# Patient Record
Sex: Female | Born: 1935 | Hispanic: No | Marital: Married | Smoking: Never smoker
Health system: Southern US, Community
[De-identification: ages and names within clinical notes are randomized; demographics above are authoritative.]

## PROBLEM LIST (undated history)

## (undated) DIAGNOSIS — K219 Gastro-esophageal reflux disease without esophagitis: Secondary | ICD-10-CM

## (undated) DIAGNOSIS — I10 Essential (primary) hypertension: Secondary | ICD-10-CM

## (undated) DIAGNOSIS — R12 Heartburn: Secondary | ICD-10-CM

---

## 2006-08-04 ENCOUNTER — Encounter: Admission: RE | Admit: 2006-08-04 | Discharge: 2006-08-04 | Payer: Self-pay | Admitting: Family Medicine

## 2009-10-01 ENCOUNTER — Emergency Department (HOSPITAL_COMMUNITY): Admission: EM | Admit: 2009-10-01 | Discharge: 2009-10-01 | Payer: Self-pay | Admitting: Emergency Medicine

## 2015-02-17 ENCOUNTER — Other Ambulatory Visit: Payer: Self-pay | Admitting: Internal Medicine

## 2015-02-17 DIAGNOSIS — Z1231 Encounter for screening mammogram for malignant neoplasm of breast: Secondary | ICD-10-CM

## 2015-02-24 ENCOUNTER — Ambulatory Visit
Admission: RE | Admit: 2015-02-24 | Discharge: 2015-02-24 | Disposition: A | Discharge status: Home / Self Care | Payer: Medicare Other | Source: Ambulatory Visit | Attending: Internal Medicine | Admitting: Internal Medicine

## 2015-02-24 DIAGNOSIS — Z1231 Encounter for screening mammogram for malignant neoplasm of breast: Secondary | ICD-10-CM

## 2016-09-05 ENCOUNTER — Other Ambulatory Visit: Payer: Self-pay | Admitting: Internal Medicine

## 2016-09-05 DIAGNOSIS — E041 Nontoxic single thyroid nodule: Secondary | ICD-10-CM

## 2016-09-05 DIAGNOSIS — M542 Cervicalgia: Secondary | ICD-10-CM

## 2016-09-19 ENCOUNTER — Ambulatory Visit
Admission: RE | Admit: 2016-09-19 | Discharge: 2016-09-19 | Disposition: A | Discharge status: Home / Self Care | Payer: Medicare Other | Source: Ambulatory Visit | Attending: Internal Medicine | Admitting: Internal Medicine

## 2016-09-19 DIAGNOSIS — E041 Nontoxic single thyroid nodule: Secondary | ICD-10-CM

## 2016-09-19 DIAGNOSIS — M542 Cervicalgia: Secondary | ICD-10-CM

## 2016-10-03 ENCOUNTER — Other Ambulatory Visit: Payer: Self-pay | Admitting: Internal Medicine

## 2016-10-03 DIAGNOSIS — E041 Nontoxic single thyroid nodule: Secondary | ICD-10-CM

## 2016-10-26 ENCOUNTER — Other Ambulatory Visit (HOSPITAL_COMMUNITY)
Admission: RE | Admit: 2016-10-26 | Discharge: 2016-10-26 | Disposition: A | Discharge status: Home / Self Care | Payer: Medicare Other | Source: Ambulatory Visit | Attending: Radiology | Admitting: Radiology

## 2016-10-26 ENCOUNTER — Ambulatory Visit
Admission: RE | Admit: 2016-10-26 | Discharge: 2016-10-26 | Disposition: A | Discharge status: Home / Self Care | Payer: Medicare Other | Source: Ambulatory Visit | Attending: Internal Medicine | Admitting: Internal Medicine

## 2016-10-26 DIAGNOSIS — E041 Nontoxic single thyroid nodule: Secondary | ICD-10-CM | POA: Diagnosis present

## 2016-12-07 ENCOUNTER — Other Ambulatory Visit: Payer: Self-pay | Admitting: Internal Medicine

## 2016-12-07 DIAGNOSIS — Z1231 Encounter for screening mammogram for malignant neoplasm of breast: Secondary | ICD-10-CM

## 2016-12-22 ENCOUNTER — Ambulatory Visit
Admission: RE | Admit: 2016-12-22 | Discharge: 2016-12-22 | Disposition: A | Discharge status: Home / Self Care | Payer: Medicare Other | Source: Ambulatory Visit | Attending: Internal Medicine | Admitting: Internal Medicine

## 2016-12-22 DIAGNOSIS — Z1231 Encounter for screening mammogram for malignant neoplasm of breast: Secondary | ICD-10-CM

## 2017-05-09 IMAGING — US US THYROID
1 series · 12 of 25 positions shown · non-contrast
Comparison: None.

CLINICAL DATA: Palpable abnormality.  Left-sided neck pain.

EXAM:
THYROID ULTRASOUND
TECHNIQUE: Ultrasound examination of the thyroid gland and adjacent soft
tissues was performed.

[Series 1: us thyroid · 0.06mm/px · 12 of 53 slices shown]
[im 3/53]
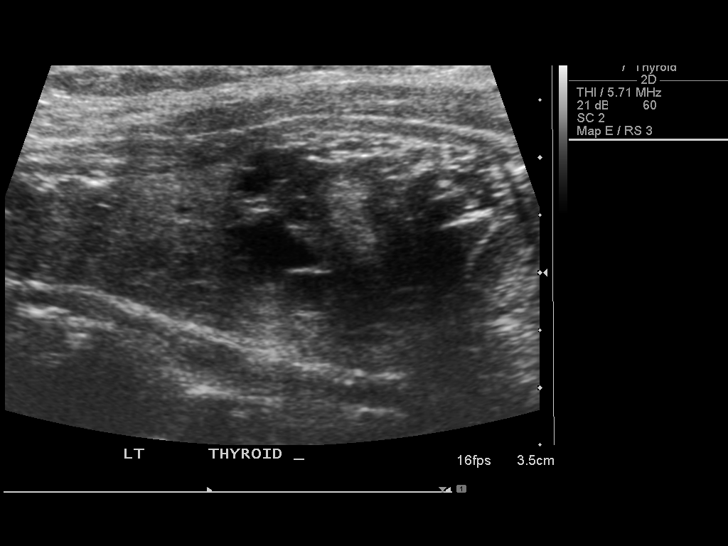
[im 7/53]
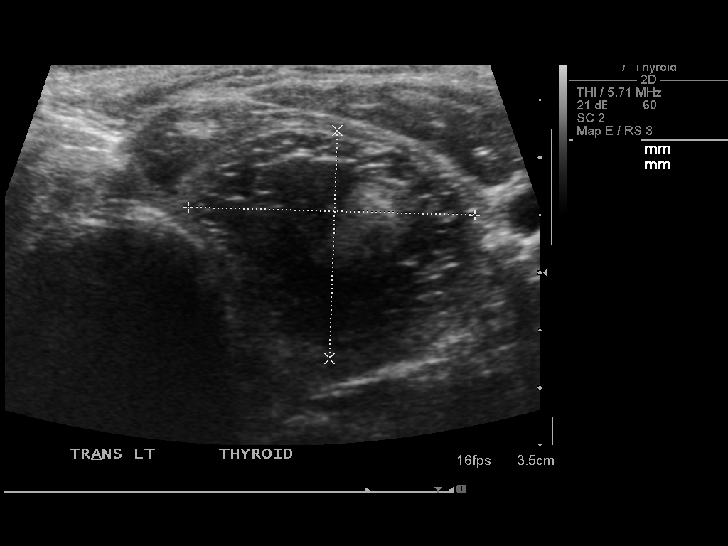
[im 11/53]
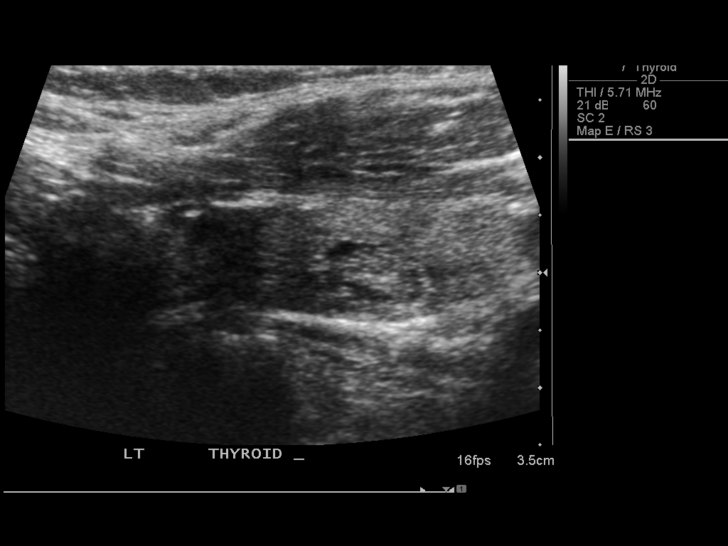
[im 16/53]
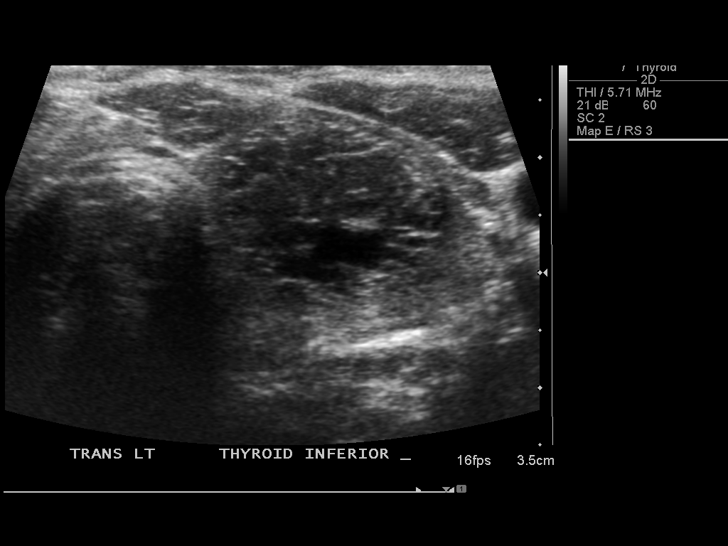
[im 20/53]
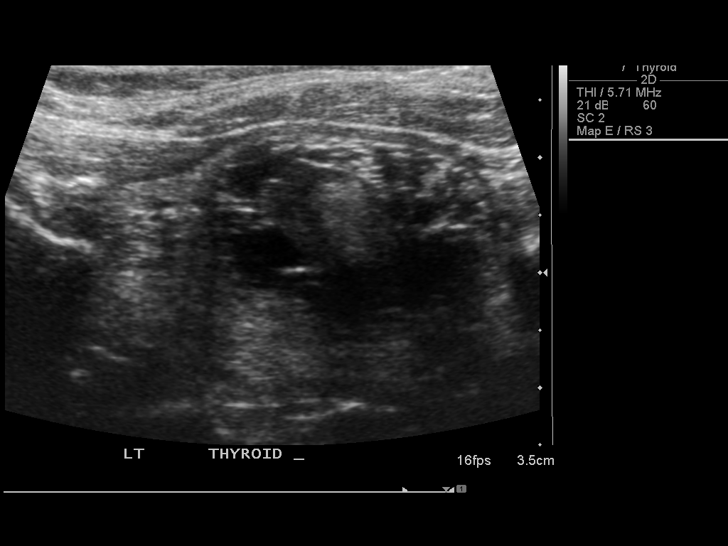
[im 24/53]
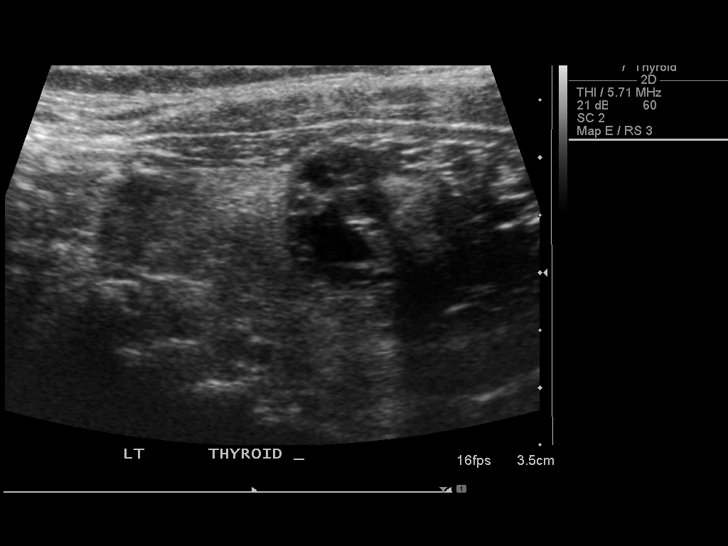
[im 29/53]
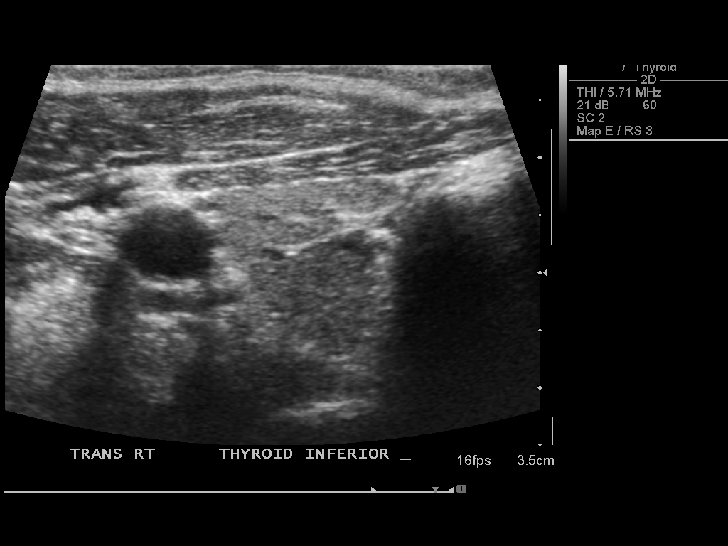
[im 33/53]
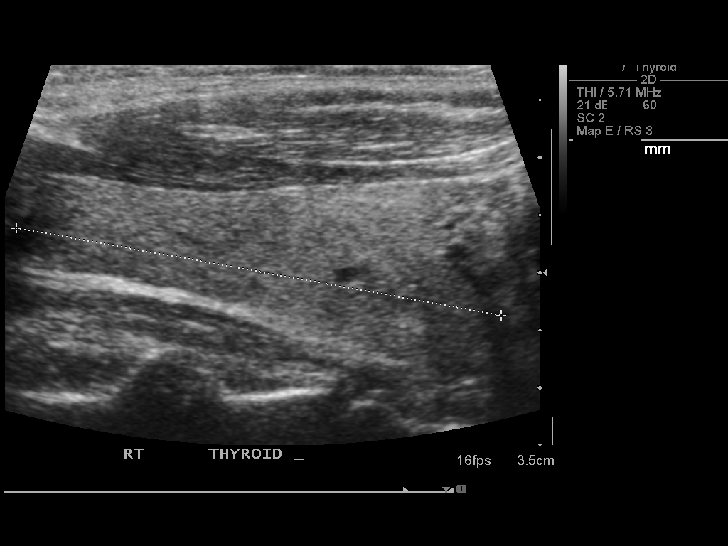
[im 37/53]
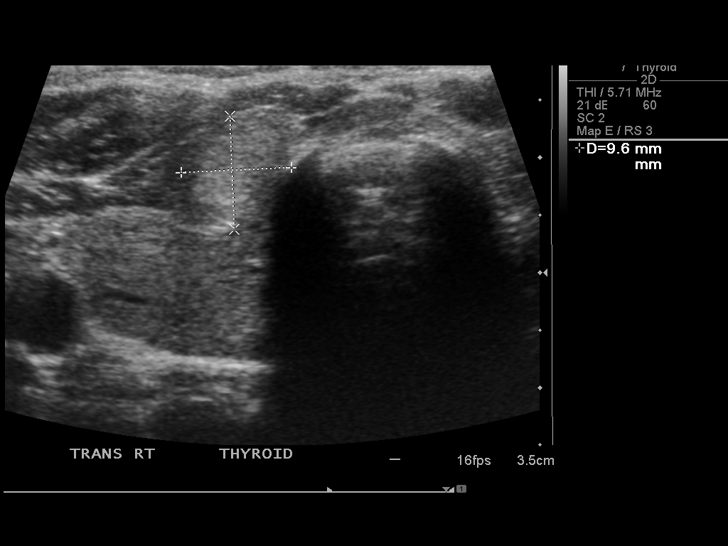
[im 42/53]
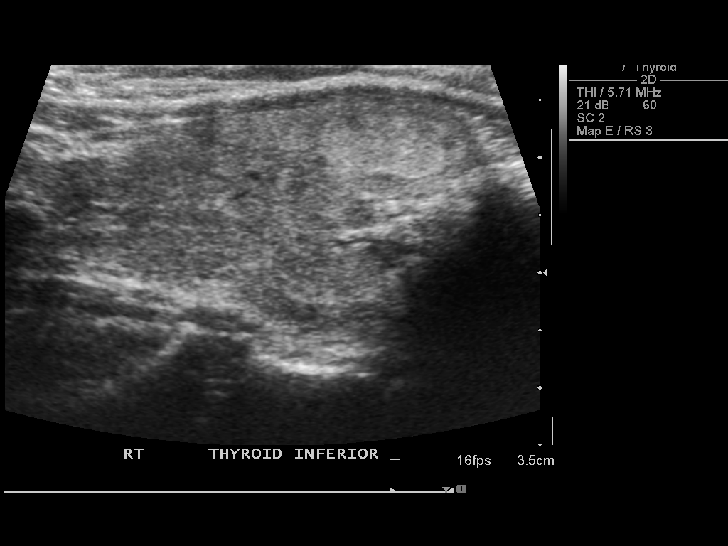
[im 46/53]
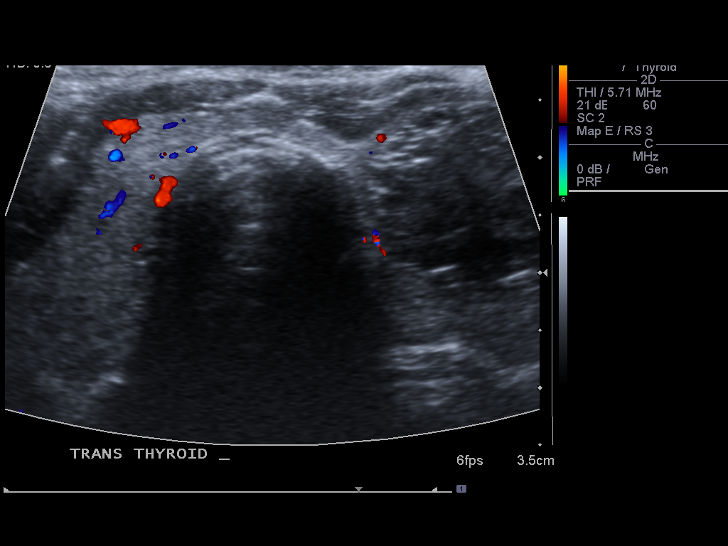
[im 50/53]
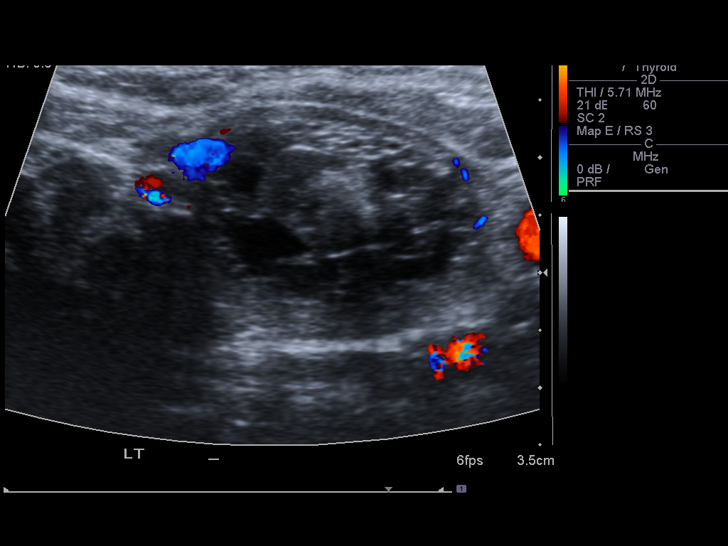

[12 of 25 positions shown; findings below may reference images not displayed]

FINDINGS: Parenchymal Echotexture: Moderately heterogenous

Estimated total number of nodules >/= 1 cm: 4

Number of spongiform nodules >/=  2 cm not described below (TR1): 0

Number of mixed cystic and solid nodules >/= 1.5 cm not described
below (TR2): 0

_________________________________________________________

Isthmus: Measures 0.3 cm in thickness.

Nodule # 1:

Location: Isthmus; Mid, right side of the isthmus.

Size: 1.4 x 1.0 x 1.0 cm.

Composition: solid/almost completely solid (2)

Echogenicity: isoechoic (1)

Shape: not taller-than-wide (0)

Margins: extra-thyroidal extension (3)

Echogenic foci: none (0)

ACR TI-RADS total points: 6.

ACR TI-RADS risk category: TR4 (4-6 points).

ACR TI-RADS recommendations:

*Given size (>/= 1 - 1.4 cm) and appearance, a follow-up ultrasound
in 1 year should be considered based on TI-RADS criteria.

_________________________________________________________

Right lobe: Measures 4.9 x 2.1 x 2.5 cm.

Nodule # 2:

Location: Right; Inferior

Size: 1.4 x 1.1 x 1.1 cm.

Composition: solid/almost completely solid (2)

Echogenicity: isoechoic (1)

Shape: not taller-than-wide (0)

Margins: extra-thyroidal extension (3)

Echogenic foci: none (0)

ACR TI-RADS total points: 6.

ACR TI-RADS risk category: TR4 (4-6 points).

ACR TI-RADS recommendations: *Given size (>/= 1 - 1.4 cm) and
appearance, a follow-up ultrasound in 1 year should be considered
based on TI-RADS criteria.

_________________________________________________________

Left lobe: Measures 4.3 x 2.1 x 1.7 cm.

Nodule # 3:

Location: Left; Inferior

Size: 3.2 x 2.0 x 2.5 cm.

Composition: cannot determine (2)

Echogenicity: very hypoechoic (3)

Shape: not taller-than-wide (0)

Margins: ill-defined (0)

Echogenic foci: none (0)

ACR TI-RADS total points: 5.

ACR TI-RADS risk category: TR4 (4-6 points).

ACR TI-RADS recommendations:

**Given size (>/= 1.5 cm) and appearance, fine needle aspiration of
this moderately suspicious nodule should be considered based on
TI-RADS criteria.

Nodule # 4:

Location: Left; Mid

Size: 1.1 x 0.7 x 1.0 cm.

Composition: spongiform (0)

Echogenicity: isoechoic (1)

Shape: not taller-than-wide (0)

Margins: smooth (0)

Echogenic foci: none (0)

ACR TI-RADS total points: 1.

ACR TI-RADS risk category: TR1 (0-1 points).

ACR TI-RADS recommendations:

This nodule does NOT meet TI-RADS criteria for biopsy or dedicated
follow-up.

There may be another small spongiform nodule in the superior left
thyroid lobe.
IMPRESSION: Bilateral thyroid nodules. The dominant left thyroid nodule (Nodule
#3) meets criteria for biopsy.

The nodule in the inferior right thyroid lobe and nodule along the
right side of the isthmus both meet criteria for 1 year follow-up.

The above is in keeping with the ACR TI-RADS recommendations - [HOSPITAL] 8513;[DATE].

## 2017-11-24 ENCOUNTER — Encounter (HOSPITAL_COMMUNITY): Payer: Self-pay

## 2017-11-24 ENCOUNTER — Other Ambulatory Visit: Payer: Self-pay

## 2017-11-24 DIAGNOSIS — R0789 Other chest pain: Secondary | ICD-10-CM | POA: Insufficient documentation

## 2017-11-24 DIAGNOSIS — I10 Essential (primary) hypertension: Secondary | ICD-10-CM | POA: Insufficient documentation

## 2017-11-24 DIAGNOSIS — K219 Gastro-esophageal reflux disease without esophagitis: Secondary | ICD-10-CM | POA: Diagnosis not present

## 2017-11-24 DIAGNOSIS — E876 Hypokalemia: Secondary | ICD-10-CM | POA: Insufficient documentation

## 2017-11-24 DIAGNOSIS — R1013 Epigastric pain: Secondary | ICD-10-CM | POA: Diagnosis present

## 2017-11-24 LAB — URINALYSIS, ROUTINE W REFLEX MICROSCOPIC
Bacteria, UA: NONE SEEN
Bilirubin Urine: NEGATIVE
GLUCOSE, UA: NEGATIVE mg/dL
Hgb urine dipstick: NEGATIVE
KETONES UR: NEGATIVE mg/dL
Nitrite: NEGATIVE
PH: 6 (ref 5.0–8.0)
Protein, ur: NEGATIVE mg/dL
Specific Gravity, Urine: 1.006 (ref 1.005–1.030)

## 2017-11-24 LAB — COMPREHENSIVE METABOLIC PANEL
ALK PHOS: 72 U/L (ref 38–126)
ALT: 21 U/L (ref 14–54)
AST: 25 U/L (ref 15–41)
Albumin: 4 g/dL (ref 3.5–5.0)
Anion gap: 13 (ref 5–15)
BILIRUBIN TOTAL: 0.5 mg/dL (ref 0.3–1.2)
BUN: 14 mg/dL (ref 6–20)
CHLORIDE: 108 mmol/L (ref 101–111)
CO2: 23 mmol/L (ref 22–32)
CREATININE: 1.05 mg/dL — AB (ref 0.44–1.00)
Calcium: 9.1 mg/dL (ref 8.9–10.3)
GFR calc Af Amer: 53 mL/min — ABNORMAL LOW (ref >60)
GFR, EST NON AFRICAN AMERICAN: 46 mL/min — AB (ref >60)
Glucose, Bld: 106 mg/dL — ABNORMAL HIGH (ref 65–99)
Potassium: 2.8 mmol/L — ABNORMAL LOW (ref 3.5–5.1)
Sodium: 144 mmol/L (ref 135–145)
Total Protein: 7.2 g/dL (ref 6.5–8.1)

## 2017-11-24 LAB — CBC
HCT: 38.2 % (ref 36.0–46.0)
Hemoglobin: 12.5 g/dL (ref 12.0–15.0)
MCH: 26.1 pg (ref 26.0–34.0)
MCHC: 32.7 g/dL (ref 30.0–36.0)
MCV: 79.7 fL (ref 78.0–100.0)
PLATELETS: 254 10*3/uL (ref 150–400)
RBC: 4.79 MIL/uL (ref 3.87–5.11)
RDW: 15.3 % (ref 11.5–15.5)
WBC: 5.8 10*3/uL (ref 4.0–10.5)

## 2017-11-24 LAB — LIPASE, BLOOD: LIPASE: 42 U/L (ref 11–51)

## 2017-11-24 NOTE — ED Triage Notes (Signed)
Pt here with family, pt speaks rade, pt endorses abd pain that began this morning with 2 episodes of diarrhea. Hypertensive in triage and hx of same. Has hx of heart burn and this feels the same.

## 2017-11-25 ENCOUNTER — Emergency Department (HOSPITAL_COMMUNITY)
Admission: EM | Admit: 2017-11-25 | Discharge: 2017-11-25 | Disposition: A | Discharge status: Home / Self Care | Payer: Medicare Other | Attending: Emergency Medicine | Admitting: Emergency Medicine

## 2017-11-25 ENCOUNTER — Emergency Department (HOSPITAL_COMMUNITY): Payer: Medicare Other

## 2017-11-25 ENCOUNTER — Other Ambulatory Visit: Payer: Self-pay

## 2017-11-25 DIAGNOSIS — K219 Gastro-esophageal reflux disease without esophagitis: Secondary | ICD-10-CM

## 2017-11-25 DIAGNOSIS — R0789 Other chest pain: Secondary | ICD-10-CM

## 2017-11-25 DIAGNOSIS — E876 Hypokalemia: Secondary | ICD-10-CM

## 2017-11-25 HISTORY — DX: Heartburn: R12

## 2017-11-25 HISTORY — DX: Essential (primary) hypertension: I10

## 2017-11-25 HISTORY — DX: Gastro-esophageal reflux disease without esophagitis: K21.9

## 2017-11-25 LAB — I-STAT TROPONIN, ED: Troponin i, poc: 0 ng/mL (ref 0.00–0.08)

## 2017-11-25 MED ORDER — POTASSIUM CHLORIDE CRYS ER 20 MEQ PO TBCR: 20.0000 meq | EXTENDED_RELEASE_TABLET | Freq: Two times a day (BID) | ORAL | 0 refills | Status: AC

## 2017-11-25 MED ORDER — POTASSIUM CHLORIDE CRYS ER 20 MEQ PO TBCR
40.0000 meq | EXTENDED_RELEASE_TABLET | Freq: Once | ORAL | Status: AC
  Administered 2017-11-25: 40 meq via ORAL
  Filled 2017-11-25: qty 2

## 2017-11-25 MED ORDER — LIDOCAINE VISCOUS 2 % MT SOLN
15.0000 mL | Freq: Once | OROMUCOSAL | Status: AC
  Administered 2017-11-25: 15 mL via OROMUCOSAL
  Filled 2017-11-25: qty 15

## 2017-11-25 MED ORDER — ONDANSETRON 4 MG PO TBDP
4.0000 mg | ORAL_TABLET | Freq: Once | ORAL | Status: AC
  Administered 2017-11-25: 4 mg via ORAL
  Filled 2017-11-25: qty 1

## 2017-11-25 MED ORDER — ALUM & MAG HYDROXIDE-SIMETH 200-200-20 MG/5ML PO SUSP
30.0000 mL | Freq: Once | ORAL | Status: AC
  Administered 2017-11-25: 30 mL via ORAL
  Filled 2017-11-25: qty 30

## 2017-11-25 NOTE — Discharge Instructions (Signed)
Please avoid NSAIDs such as aspirin (Goody powders), ibuprofen (Motrin, Advil), naproxen (Aleve) as these may worsen your symptoms.  Tylenol 1000 mg every 6 hours is safe to take as long as you have no history of liver problems (heavy alcohol use, cirrhosis, hepatitis).  Please avoid spicy, acidic (citrus fruits, tomato based sauces, salsa), greasy, fatty foods.  Please avoid caffeine and alcohol.     You may use Maalox over-the-counter as needed for heartburn.  Please continue your omeprazole.  Please follow-up with your primary care physician in 1 week to have your potassium rechecked.  It was 2.8 today.  Your cardiac labs today were normal.  Your abdominal labs are normal.

## 2017-11-25 NOTE — ED Provider Notes (Signed)
TIME SEEN: 2:13 AM  CHIEF COMPLAINT: Chest pain, abdominal pain  HPI: Patient is an 84 year old female with history of hypertension, acid reflux who presents to the emergency department with abdominal pain and chest pain that started at 4 PM yesterday.  Describes it as a burning pain that goes up into her chest and she has a sour taste in her mouth.  States that this feels similar to her heartburn.  She is on omeprazole at home which she took without any relief.  Symptoms worse after eating.  No shortness of breath, nausea or vomiting, diaphoresis or dizziness.  No bloody stool or melena.  She has had previous hyste[place] in [place].  Most of her discomfort is in the epigastric region.  No history of CAD.  ROS: See HPI Constitutional: no fever  Eyes: no drainage  ENT: no runny nose   Cardiovascular:   chest pain  Resp: no SOB  GI: no vomiting GU: no dysuria Integumentary: no rash  Allergy: no hives  Musculoskeletal: no leg swelling  Neurological: no slurred speech ROS otherwise negative  PAST MEDICAL HISTORY/PAST SURGICAL HISTORY:  Past Medical History:  Diagnosis Date  . Acid reflux   . Heart burn   . Hypertension     MEDICATIONS:  Prior to Admission medications   Not on File    ALLERGIES:  No Known Allergies  SOCIAL HISTORY:  Social History   Tobacco Use  . Smoking status: Never Smoker  Substance Use Topics  . Alcohol use: No    Frequency: Never    FAMILY HISTORY: Family History  Problem Relation Age of Onset  . Breast cancer Neg Hx     EXAM: BP (!) 163/90   Pulse 75   Temp 98.4 F (36.9 C) (Oral)   Resp 16   Ht 4\' 8"  (1.422 m)   Wt 59 kg (130 lb)   LMP  (LMP Unknown)   SpO2 96%   BMI 29.15 kg/m  CONSTITUTIONAL: Alert and oriented and responds appropriately to questions. Well-appearing; well-nourished HEAD: Normocephalic EYES: Conjunctivae clear, pupils appear equal, EOMI ENT: normal nose; moist mucous membranes NECK: Supple, no meningismus, no  nuchal rigidity, no LAD  CARD: RRR; S1 and S2 appreciated; no murmurs, no clicks, no rubs, no gallops RESP: Normal chest excursion without splinting or tachypnea; breath sounds clear and equal bilaterally; no wheezes, no rhonchi, no rales, no hypoxia or respiratory distress, speaking full sentences ABD/GI: Normal bowel sounds; non-distended; soft, non-tender, no rebound, no guarding, no peritoneal signs, no hepatosplenomegaly, no tenderness at McBurney's point, negative Murphy sign BACK:  The back appears normal and is non-tender to palpation, there is no CVA tenderness EXT: Normal ROM in all joints; non-tender to palpation; no edema; normal capillary refill; no cyanosis, no calf tenderness or swelling    SKIN: Normal color for age and race; warm; no rash NEURO: Moves all extremities equally PSYCH: The patient's mood and manner are appropriate. Grooming and personal hygiene are appropriate.  MEDICAL DECISION MAKING: Patient here with symptoms that sound like GERD.  Worse after eating with a burning feeling in her abdomen and chest with a sour taste in her mouth.  I doubt that this is ACS, PE or dissection but given her age and that she is hypertensive will obtain troponin, chest x-ray.  EKG shows no ischemic change.  Abdominal exam completely benign.  Potassium is slightly low at 2.8 without EKG changes.  Will give oral replacement.  Recommended her PCP recheck this in 1 week.  Doubt cholecystitis, cholelithiasis, pancreatitis, appendicitis based on benign exam.  LFTs, lipase normal.  I do not feel she needs emergent abdominal imaging.  ED PROGRESS: Patient's chest x-ray is clear.  Troponin is negative.  Patient reports feeling much better after Maalox and lidocaine.  I feel she is safe to be discharged.  We will have them continue omeprazole and use Maalox over-the-counter.  Recommended diet changes.  Will have her potassium rechecked in 1 week with her PCP.  Will discharge with potassium tablets.  At  this time, I do not feel there is any life-threatening condition present. I have reviewed and discussed all results (EKG, imaging, lab, urine as appropriate) and exam findings with patient/family. I have reviewed nursing notes and appropriate previous records.  I feel the patient is safe to be discharged home without further emergent workup and can continue workup as an outpatient as needed. Discussed usual and customary return precautions. Patient/family verbalize understanding and are comfortable with this plan.  Outpatient follow-up has been provided if needed. All questions have been answered.      EKG Interpretation  Date/Time:  Friday November 24 2017 19:47:46 EST Ventricular Rate:  88 PR Interval:  162 QRS Duration: 82 QT Interval:  382 QTC Calculation: 462 R Axis:   17 Text Interpretation:  Normal sinus rhythm Nonspecific ST abnormality Abnormal ECG No old tracing to compare Confirmed by Domnick Chervenak, Baxter Hire [id number]) on 11/25/2017 2:13:00 AM         Caya Soberanis, Layla Maw, DO 11/25/17 0448

## 2018-01-17 ENCOUNTER — Other Ambulatory Visit: Payer: Self-pay | Admitting: Internal Medicine

## 2018-01-17 DIAGNOSIS — Z1231 Encounter for screening mammogram for malignant neoplasm of breast: Secondary | ICD-10-CM

## 2018-02-12 ENCOUNTER — Ambulatory Visit
Admission: RE | Admit: 2018-02-12 | Discharge: 2018-02-12 | Disposition: A | Discharge status: Home / Self Care | Payer: Medicare Other | Source: Ambulatory Visit | Attending: Internal Medicine | Admitting: Internal Medicine

## 2018-02-12 DIAGNOSIS — Z1231 Encounter for screening mammogram for malignant neoplasm of breast: Secondary | ICD-10-CM

## 2018-07-15 IMAGING — DX DG CHEST 2V
2 series · 2 of 2 positions shown · non-contrast
Comparison: 10/01/2009

CLINICAL DATA: Chest and abdominal pain

EXAM:
CHEST  2 VIEW

[chest lat]
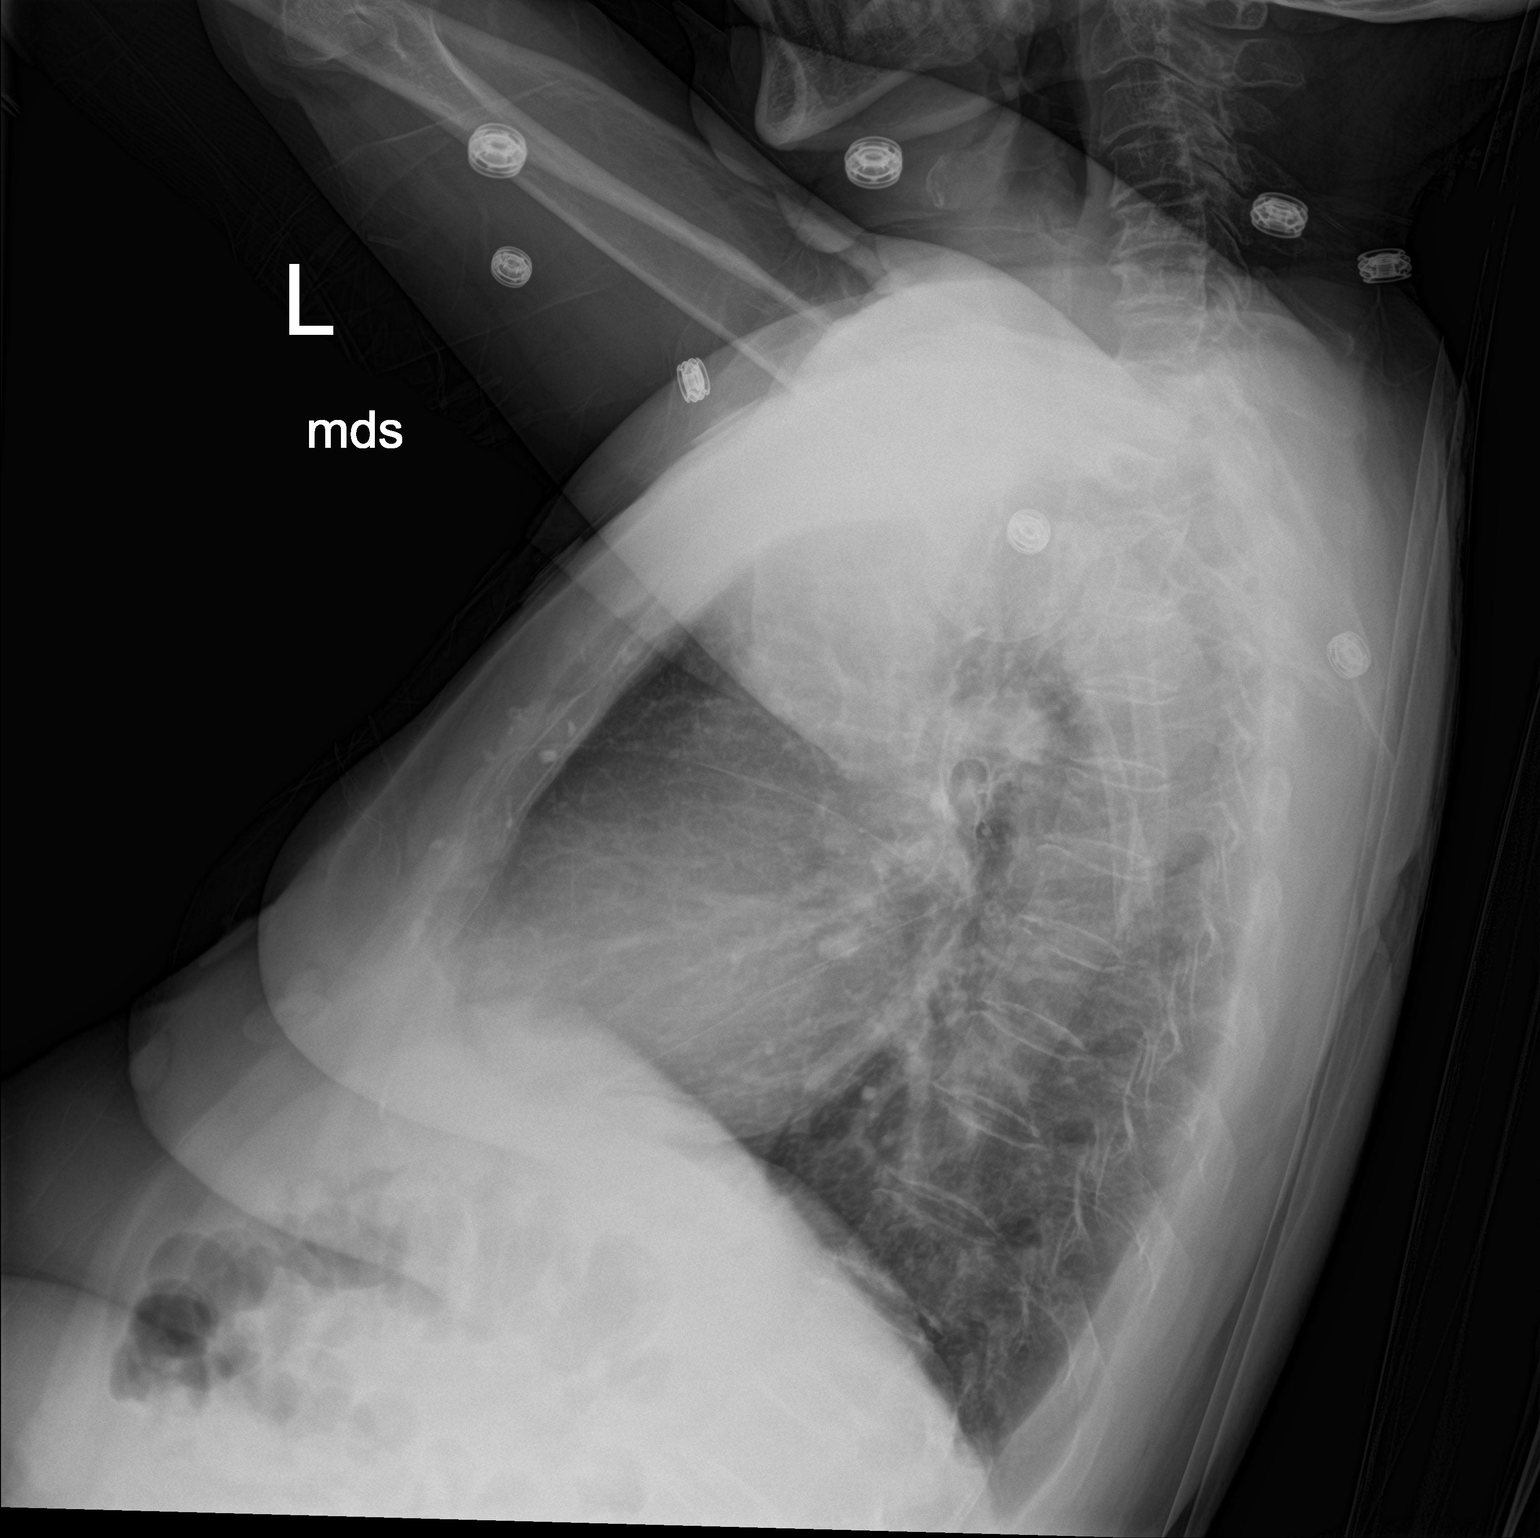

[chest ap]
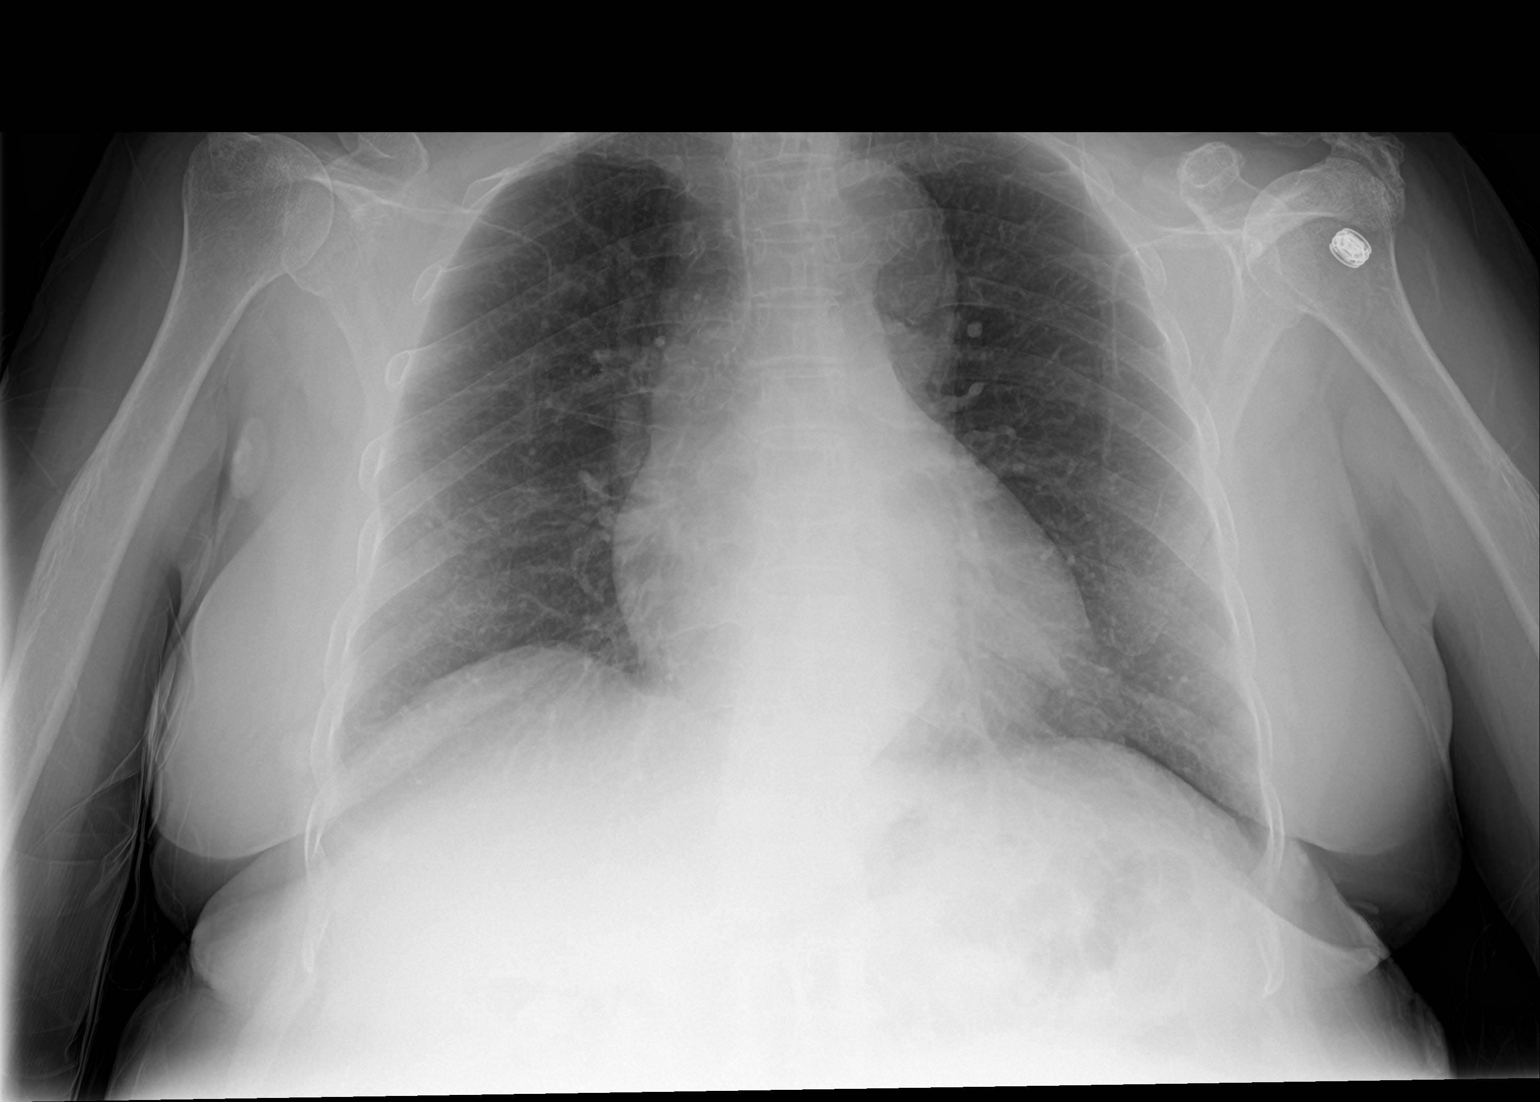

[2 of 2 positions shown; findings below may reference images not displayed]

FINDINGS: Mildly low lung volumes. No focal consolidation or pleural effusion.
Stable cardiomediastinal silhouette with tortuous aorta. No
pneumothorax.
IMPRESSION: No active cardiopulmonary disease.

## 2018-10-24 DIAGNOSIS — J40 Bronchitis, not specified as acute or chronic: Secondary | ICD-10-CM | POA: Diagnosis not present

## 2018-12-11 DIAGNOSIS — H43393 Other vitreous opacities, bilateral: Secondary | ICD-10-CM | POA: Diagnosis not present

## 2019-05-06 DIAGNOSIS — I10 Essential (primary) hypertension: Secondary | ICD-10-CM | POA: Diagnosis not present

## 2019-05-06 DIAGNOSIS — Z0000 Encounter for general adult medical examination without abnormal findings: Secondary | ICD-10-CM | POA: Diagnosis not present

## 2019-05-06 DIAGNOSIS — E041 Nontoxic single thyroid nodule: Secondary | ICD-10-CM | POA: Diagnosis not present

## 2019-05-06 DIAGNOSIS — E7800 Pure hypercholesterolemia, unspecified: Secondary | ICD-10-CM | POA: Diagnosis not present

## 2019-05-06 DIAGNOSIS — K219 Gastro-esophageal reflux disease without esophagitis: Secondary | ICD-10-CM | POA: Diagnosis not present

## 2019-05-06 DIAGNOSIS — Z1389 Encounter for screening for other disorder: Secondary | ICD-10-CM | POA: Diagnosis not present

## 2019-05-06 DIAGNOSIS — R7303 Prediabetes: Secondary | ICD-10-CM | POA: Diagnosis not present

## 2019-05-06 DIAGNOSIS — E559 Vitamin D deficiency, unspecified: Secondary | ICD-10-CM | POA: Diagnosis not present

## 2019-05-13 DIAGNOSIS — E041 Nontoxic single thyroid nodule: Secondary | ICD-10-CM | POA: Diagnosis not present

## 2019-05-13 DIAGNOSIS — R7303 Prediabetes: Secondary | ICD-10-CM | POA: Diagnosis not present

## 2019-05-13 DIAGNOSIS — I10 Essential (primary) hypertension: Secondary | ICD-10-CM | POA: Diagnosis not present

## 2019-05-13 DIAGNOSIS — E7800 Pure hypercholesterolemia, unspecified: Secondary | ICD-10-CM | POA: Diagnosis not present

## 2019-08-12 DIAGNOSIS — I10 Essential (primary) hypertension: Secondary | ICD-10-CM | POA: Diagnosis not present

## 2019-08-12 DIAGNOSIS — R7303 Prediabetes: Secondary | ICD-10-CM | POA: Diagnosis not present

## 2019-08-12 DIAGNOSIS — M1990 Unspecified osteoarthritis, unspecified site: Secondary | ICD-10-CM | POA: Diagnosis not present

## 2019-08-12 DIAGNOSIS — E7800 Pure hypercholesterolemia, unspecified: Secondary | ICD-10-CM | POA: Diagnosis not present

## 2019-08-12 DIAGNOSIS — K219 Gastro-esophageal reflux disease without esophagitis: Secondary | ICD-10-CM | POA: Diagnosis not present

## 2019-08-12 DIAGNOSIS — E041 Nontoxic single thyroid nodule: Secondary | ICD-10-CM | POA: Diagnosis not present

## 2019-08-27 DIAGNOSIS — H1013 Acute atopic conjunctivitis, bilateral: Secondary | ICD-10-CM | POA: Diagnosis not present

## 2019-08-27 DIAGNOSIS — H40033 Anatomical narrow angle, bilateral: Secondary | ICD-10-CM | POA: Diagnosis not present

## 2019-12-11 DIAGNOSIS — E7800 Pure hypercholesterolemia, unspecified: Secondary | ICD-10-CM | POA: Diagnosis not present

## 2019-12-11 DIAGNOSIS — I10 Essential (primary) hypertension: Secondary | ICD-10-CM | POA: Diagnosis not present

## 2019-12-11 DIAGNOSIS — R7303 Prediabetes: Secondary | ICD-10-CM | POA: Diagnosis not present

## 2019-12-11 DIAGNOSIS — K219 Gastro-esophageal reflux disease without esophagitis: Secondary | ICD-10-CM | POA: Diagnosis not present

## 2019-12-11 DIAGNOSIS — M1990 Unspecified osteoarthritis, unspecified site: Secondary | ICD-10-CM | POA: Diagnosis not present

## 2020-10-06 DIAGNOSIS — H1013 Acute atopic conjunctivitis, bilateral: Secondary | ICD-10-CM | POA: Diagnosis not present

## 2020-10-06 DIAGNOSIS — H40033 Anatomical narrow angle, bilateral: Secondary | ICD-10-CM | POA: Diagnosis not present

## 2020-12-29 DIAGNOSIS — E7800 Pure hypercholesterolemia, unspecified: Secondary | ICD-10-CM | POA: Diagnosis not present

## 2020-12-29 DIAGNOSIS — Z1389 Encounter for screening for other disorder: Secondary | ICD-10-CM | POA: Diagnosis not present

## 2020-12-29 DIAGNOSIS — R109 Unspecified abdominal pain: Secondary | ICD-10-CM | POA: Diagnosis not present

## 2020-12-29 DIAGNOSIS — I10 Essential (primary) hypertension: Secondary | ICD-10-CM | POA: Diagnosis not present

## 2020-12-29 DIAGNOSIS — R7303 Prediabetes: Secondary | ICD-10-CM | POA: Diagnosis not present

## 2020-12-29 DIAGNOSIS — M1990 Unspecified osteoarthritis, unspecified site: Secondary | ICD-10-CM | POA: Diagnosis not present

## 2020-12-29 DIAGNOSIS — K219 Gastro-esophageal reflux disease without esophagitis: Secondary | ICD-10-CM | POA: Diagnosis not present

## 2020-12-29 DIAGNOSIS — Z0000 Encounter for general adult medical examination without abnormal findings: Secondary | ICD-10-CM | POA: Diagnosis not present

## 2020-12-30 ENCOUNTER — Other Ambulatory Visit: Payer: Self-pay | Admitting: Internal Medicine

## 2020-12-30 DIAGNOSIS — R109 Unspecified abdominal pain: Secondary | ICD-10-CM

## 2021-01-14 ENCOUNTER — Ambulatory Visit
Admission: RE | Admit: 2021-01-14 | Discharge: 2021-01-14 | Disposition: A | Discharge status: Home / Self Care | Payer: PPO | Source: Ambulatory Visit | Attending: Internal Medicine | Admitting: Internal Medicine

## 2021-01-14 DIAGNOSIS — N83201 Unspecified ovarian cyst, right side: Secondary | ICD-10-CM | POA: Diagnosis not present

## 2021-01-14 DIAGNOSIS — R109 Unspecified abdominal pain: Secondary | ICD-10-CM | POA: Diagnosis not present

## 2021-01-14 DIAGNOSIS — K7689 Other specified diseases of liver: Secondary | ICD-10-CM | POA: Diagnosis not present

## 2021-01-14 DIAGNOSIS — M4316 Spondylolisthesis, lumbar region: Secondary | ICD-10-CM | POA: Diagnosis not present

## 2021-01-14 MED ORDER — IOPAMIDOL (ISOVUE-300) INJECTION 61%
100.0000 mL | Freq: Once | INTRAVENOUS | Status: AC | PRN
  Administered 2021-01-14: 100 mL via INTRAVENOUS

## 2021-06-01 DIAGNOSIS — M810 Age-related osteoporosis without current pathological fracture: Secondary | ICD-10-CM | POA: Diagnosis not present

## 2021-06-01 DIAGNOSIS — K219 Gastro-esophageal reflux disease without esophagitis: Secondary | ICD-10-CM | POA: Diagnosis not present

## 2021-06-01 DIAGNOSIS — I10 Essential (primary) hypertension: Secondary | ICD-10-CM | POA: Diagnosis not present

## 2021-06-01 DIAGNOSIS — R7303 Prediabetes: Secondary | ICD-10-CM | POA: Diagnosis not present

## 2021-06-01 DIAGNOSIS — E7800 Pure hypercholesterolemia, unspecified: Secondary | ICD-10-CM | POA: Diagnosis not present

## 2021-06-01 DIAGNOSIS — M1990 Unspecified osteoarthritis, unspecified site: Secondary | ICD-10-CM | POA: Diagnosis not present

## 2021-10-08 DIAGNOSIS — H10023 Other mucopurulent conjunctivitis, bilateral: Secondary | ICD-10-CM | POA: Diagnosis not present

## 2021-10-08 DIAGNOSIS — S0502XA Injury of conjunctiva and corneal abrasion without foreign body, left eye, initial encounter: Secondary | ICD-10-CM | POA: Diagnosis not present

## 2021-10-09 DIAGNOSIS — H04123 Dry eye syndrome of bilateral lacrimal glands: Secondary | ICD-10-CM | POA: Diagnosis not present

## 2021-11-18 DIAGNOSIS — E7800 Pure hypercholesterolemia, unspecified: Secondary | ICD-10-CM | POA: Diagnosis not present

## 2021-11-18 DIAGNOSIS — E041 Nontoxic single thyroid nodule: Secondary | ICD-10-CM | POA: Diagnosis not present

## 2021-11-18 DIAGNOSIS — R7303 Prediabetes: Secondary | ICD-10-CM | POA: Diagnosis not present

## 2021-11-18 DIAGNOSIS — K219 Gastro-esophageal reflux disease without esophagitis: Secondary | ICD-10-CM | POA: Diagnosis not present

## 2021-11-18 DIAGNOSIS — M810 Age-related osteoporosis without current pathological fracture: Secondary | ICD-10-CM | POA: Diagnosis not present

## 2021-11-18 DIAGNOSIS — Z0000 Encounter for general adult medical examination without abnormal findings: Secondary | ICD-10-CM | POA: Diagnosis not present

## 2021-11-18 DIAGNOSIS — Z1389 Encounter for screening for other disorder: Secondary | ICD-10-CM | POA: Diagnosis not present

## 2021-11-18 DIAGNOSIS — M5090 Cervical disc disorder, unspecified, unspecified cervical region: Secondary | ICD-10-CM | POA: Diagnosis not present

## 2021-11-18 DIAGNOSIS — I10 Essential (primary) hypertension: Secondary | ICD-10-CM | POA: Diagnosis not present

## 2021-11-18 DIAGNOSIS — M1990 Unspecified osteoarthritis, unspecified site: Secondary | ICD-10-CM | POA: Diagnosis not present

## 2021-11-18 DIAGNOSIS — E559 Vitamin D deficiency, unspecified: Secondary | ICD-10-CM | POA: Diagnosis not present

## 2021-12-15 DIAGNOSIS — I10 Essential (primary) hypertension: Secondary | ICD-10-CM | POA: Diagnosis not present

## 2021-12-15 DIAGNOSIS — R7303 Prediabetes: Secondary | ICD-10-CM | POA: Diagnosis not present

## 2022-01-17 DIAGNOSIS — I10 Essential (primary) hypertension: Secondary | ICD-10-CM | POA: Diagnosis not present

## 2022-05-27 DIAGNOSIS — R7303 Prediabetes: Secondary | ICD-10-CM | POA: Diagnosis not present

## 2022-05-27 DIAGNOSIS — E7800 Pure hypercholesterolemia, unspecified: Secondary | ICD-10-CM | POA: Diagnosis not present

## 2022-05-27 DIAGNOSIS — I10 Essential (primary) hypertension: Secondary | ICD-10-CM | POA: Diagnosis not present

## 2022-05-27 DIAGNOSIS — I700 Atherosclerosis of aorta: Secondary | ICD-10-CM | POA: Diagnosis not present

## 2022-05-27 DIAGNOSIS — N1831 Chronic kidney disease, stage 3a: Secondary | ICD-10-CM | POA: Diagnosis not present

## 2022-05-27 DIAGNOSIS — M5090 Cervical disc disorder, unspecified, unspecified cervical region: Secondary | ICD-10-CM | POA: Diagnosis not present

## 2022-05-27 DIAGNOSIS — K219 Gastro-esophageal reflux disease without esophagitis: Secondary | ICD-10-CM | POA: Diagnosis not present

## 2022-12-21 DIAGNOSIS — E7800 Pure hypercholesterolemia, unspecified: Secondary | ICD-10-CM | POA: Diagnosis not present

## 2022-12-21 DIAGNOSIS — N1831 Chronic kidney disease, stage 3a: Secondary | ICD-10-CM | POA: Diagnosis not present

## 2022-12-21 DIAGNOSIS — Z1331 Encounter for screening for depression: Secondary | ICD-10-CM | POA: Diagnosis not present

## 2022-12-21 DIAGNOSIS — Z0000 Encounter for general adult medical examination without abnormal findings: Secondary | ICD-10-CM | POA: Diagnosis not present

## 2022-12-21 DIAGNOSIS — M810 Age-related osteoporosis without current pathological fracture: Secondary | ICD-10-CM | POA: Diagnosis not present

## 2022-12-21 DIAGNOSIS — K219 Gastro-esophageal reflux disease without esophagitis: Secondary | ICD-10-CM | POA: Diagnosis not present

## 2022-12-21 DIAGNOSIS — I10 Essential (primary) hypertension: Secondary | ICD-10-CM | POA: Diagnosis not present

## 2022-12-21 DIAGNOSIS — R7303 Prediabetes: Secondary | ICD-10-CM | POA: Diagnosis not present

## 2022-12-21 DIAGNOSIS — I700 Atherosclerosis of aorta: Secondary | ICD-10-CM | POA: Diagnosis not present

## 2022-12-21 DIAGNOSIS — M5090 Cervical disc disorder, unspecified, unspecified cervical region: Secondary | ICD-10-CM | POA: Diagnosis not present

## 2023-01-24 DIAGNOSIS — I10 Essential (primary) hypertension: Secondary | ICD-10-CM | POA: Diagnosis not present

## 2023-06-01 DIAGNOSIS — M810 Age-related osteoporosis without current pathological fracture: Secondary | ICD-10-CM | POA: Diagnosis not present

## 2023-06-01 DIAGNOSIS — N1831 Chronic kidney disease, stage 3a: Secondary | ICD-10-CM | POA: Diagnosis not present

## 2023-06-01 DIAGNOSIS — M1990 Unspecified osteoarthritis, unspecified site: Secondary | ICD-10-CM | POA: Diagnosis not present

## 2023-06-01 DIAGNOSIS — I700 Atherosclerosis of aorta: Secondary | ICD-10-CM | POA: Diagnosis not present

## 2023-06-01 DIAGNOSIS — E041 Nontoxic single thyroid nodule: Secondary | ICD-10-CM | POA: Diagnosis not present

## 2023-06-01 DIAGNOSIS — E7800 Pure hypercholesterolemia, unspecified: Secondary | ICD-10-CM | POA: Diagnosis not present

## 2023-06-01 DIAGNOSIS — I10 Essential (primary) hypertension: Secondary | ICD-10-CM | POA: Diagnosis not present

## 2023-06-01 DIAGNOSIS — R7303 Prediabetes: Secondary | ICD-10-CM | POA: Diagnosis not present

## 2023-06-01 DIAGNOSIS — K219 Gastro-esophageal reflux disease without esophagitis: Secondary | ICD-10-CM | POA: Diagnosis not present

## 2023-06-01 DIAGNOSIS — E559 Vitamin D deficiency, unspecified: Secondary | ICD-10-CM | POA: Diagnosis not present

## 2023-06-01 DIAGNOSIS — M5090 Cervical disc disorder, unspecified, unspecified cervical region: Secondary | ICD-10-CM | POA: Diagnosis not present

## 2023-12-25 DIAGNOSIS — M810 Age-related osteoporosis without current pathological fracture: Secondary | ICD-10-CM | POA: Diagnosis not present

## 2023-12-25 DIAGNOSIS — N1831 Chronic kidney disease, stage 3a: Secondary | ICD-10-CM | POA: Diagnosis not present

## 2023-12-25 DIAGNOSIS — I10 Essential (primary) hypertension: Secondary | ICD-10-CM | POA: Diagnosis not present

## 2023-12-25 DIAGNOSIS — B351 Tinea unguium: Secondary | ICD-10-CM | POA: Diagnosis not present

## 2023-12-25 DIAGNOSIS — Z23 Encounter for immunization: Secondary | ICD-10-CM | POA: Diagnosis not present

## 2023-12-25 DIAGNOSIS — E041 Nontoxic single thyroid nodule: Secondary | ICD-10-CM | POA: Diagnosis not present

## 2023-12-25 DIAGNOSIS — Z1331 Encounter for screening for depression: Secondary | ICD-10-CM | POA: Diagnosis not present

## 2023-12-25 DIAGNOSIS — R7303 Prediabetes: Secondary | ICD-10-CM | POA: Diagnosis not present

## 2023-12-25 DIAGNOSIS — E7800 Pure hypercholesterolemia, unspecified: Secondary | ICD-10-CM | POA: Diagnosis not present

## 2023-12-25 DIAGNOSIS — I700 Atherosclerosis of aorta: Secondary | ICD-10-CM | POA: Diagnosis not present

## 2023-12-25 DIAGNOSIS — M5090 Cervical disc disorder, unspecified, unspecified cervical region: Secondary | ICD-10-CM | POA: Diagnosis not present

## 2023-12-25 DIAGNOSIS — K219 Gastro-esophageal reflux disease without esophagitis: Secondary | ICD-10-CM | POA: Diagnosis not present

## 2023-12-25 DIAGNOSIS — Z0000 Encounter for general adult medical examination without abnormal findings: Secondary | ICD-10-CM | POA: Diagnosis not present

## 2023-12-25 DIAGNOSIS — E559 Vitamin D deficiency, unspecified: Secondary | ICD-10-CM | POA: Diagnosis not present

## 2024-06-03 DIAGNOSIS — E559 Vitamin D deficiency, unspecified: Secondary | ICD-10-CM | POA: Diagnosis not present

## 2024-06-03 DIAGNOSIS — I10 Essential (primary) hypertension: Secondary | ICD-10-CM | POA: Diagnosis not present

## 2024-06-03 DIAGNOSIS — M1990 Unspecified osteoarthritis, unspecified site: Secondary | ICD-10-CM | POA: Diagnosis not present

## 2024-06-03 DIAGNOSIS — M79604 Pain in right leg: Secondary | ICD-10-CM | POA: Diagnosis not present

## 2024-06-03 DIAGNOSIS — M79605 Pain in left leg: Secondary | ICD-10-CM | POA: Diagnosis not present

## 2024-06-03 DIAGNOSIS — R7303 Prediabetes: Secondary | ICD-10-CM | POA: Diagnosis not present

## 2024-06-03 DIAGNOSIS — E041 Nontoxic single thyroid nodule: Secondary | ICD-10-CM | POA: Diagnosis not present

## 2024-06-03 DIAGNOSIS — I700 Atherosclerosis of aorta: Secondary | ICD-10-CM | POA: Diagnosis not present
